# Patient Record
Sex: Female | Born: 1965
Health system: Southern US, Community
[De-identification: ages and names within clinical notes are randomized; demographics above are authoritative.]

## PROBLEM LIST (undated history)

## (undated) DIAGNOSIS — B9689 Other specified bacterial agents as the cause of diseases classified elsewhere: Secondary | ICD-10-CM

## (undated) DIAGNOSIS — N760 Acute vaginitis: Secondary | ICD-10-CM

## (undated) HISTORY — PX: BACK SURGERY: SHX140

---

## 1997-07-30 ENCOUNTER — Other Ambulatory Visit: Admission: RE | Admit: 1997-07-30 | Discharge: 1997-07-30 | Payer: Self-pay | Admitting: Gynecology

## 1997-08-06 ENCOUNTER — Other Ambulatory Visit: Admission: RE | Admit: 1997-08-06 | Discharge: 1997-08-06 | Payer: Self-pay | Admitting: Obstetrics and Gynecology

## 1997-09-15 ENCOUNTER — Inpatient Hospital Stay (HOSPITAL_COMMUNITY): Admission: AD | Admit: 1997-09-15 | Discharge: 1997-09-15 | Payer: Self-pay | Admitting: Gynecology

## 1998-03-06 ENCOUNTER — Inpatient Hospital Stay (HOSPITAL_COMMUNITY): Admission: AD | Admit: 1998-03-06 | Discharge: 1998-03-06 | Payer: Self-pay | Admitting: Gynecology

## 1998-03-21 ENCOUNTER — Inpatient Hospital Stay (HOSPITAL_COMMUNITY): Admission: AD | Admit: 1998-03-21 | Discharge: 1998-03-23 | Payer: Self-pay | Admitting: Obstetrics and Gynecology

## 1998-05-16 ENCOUNTER — Other Ambulatory Visit: Admission: RE | Admit: 1998-05-16 | Discharge: 1998-05-16 | Payer: Self-pay | Admitting: Gynecology

## 1999-01-27 ENCOUNTER — Emergency Department (HOSPITAL_COMMUNITY): Admission: EM | Admit: 1999-01-27 | Discharge: 1999-01-28 | Payer: Self-pay | Admitting: Emergency Medicine

## 2001-08-19 ENCOUNTER — Inpatient Hospital Stay (HOSPITAL_COMMUNITY): Admission: AD | Admit: 2001-08-19 | Discharge: 2001-08-19 | Payer: Self-pay | Admitting: Obstetrics and Gynecology

## 2001-12-10 ENCOUNTER — Inpatient Hospital Stay (HOSPITAL_COMMUNITY): Admission: AD | Admit: 2001-12-10 | Discharge: 2001-12-10 | Payer: Self-pay | Admitting: Obstetrics

## 2002-02-05 ENCOUNTER — Inpatient Hospital Stay (HOSPITAL_COMMUNITY): Admission: AD | Admit: 2002-02-05 | Discharge: 2002-02-05 | Payer: Self-pay | Admitting: Obstetrics

## 2002-02-18 ENCOUNTER — Inpatient Hospital Stay (HOSPITAL_COMMUNITY): Admission: AD | Admit: 2002-02-18 | Discharge: 2002-02-18 | Payer: Self-pay | Admitting: Obstetrics

## 2002-02-26 ENCOUNTER — Encounter: Payer: Self-pay | Admitting: Obstetrics

## 2002-02-26 ENCOUNTER — Inpatient Hospital Stay (HOSPITAL_COMMUNITY): Admission: AD | Admit: 2002-02-26 | Discharge: 2002-02-27 | Payer: Self-pay | Admitting: Obstetrics

## 2003-03-10 ENCOUNTER — Emergency Department (HOSPITAL_COMMUNITY): Admission: EM | Admit: 2003-03-10 | Discharge: 2003-03-10 | Payer: Self-pay | Admitting: Emergency Medicine

## 2004-06-07 ENCOUNTER — Emergency Department (HOSPITAL_COMMUNITY): Admission: EM | Admit: 2004-06-07 | Discharge: 2004-06-07 | Payer: Self-pay | Admitting: Emergency Medicine

## 2004-06-15 ENCOUNTER — Emergency Department (HOSPITAL_COMMUNITY): Admission: EM | Admit: 2004-06-15 | Discharge: 2004-06-15 | Payer: Self-pay | Admitting: Emergency Medicine

## 2004-06-19 ENCOUNTER — Emergency Department (HOSPITAL_COMMUNITY): Admission: EM | Admit: 2004-06-19 | Discharge: 2004-06-19 | Payer: Self-pay | Admitting: Family Medicine

## 2004-06-21 ENCOUNTER — Ambulatory Visit (HOSPITAL_COMMUNITY): Admission: RE | Admit: 2004-06-21 | Discharge: 2004-06-21 | Payer: Self-pay | Admitting: Family Medicine

## 2004-07-05 ENCOUNTER — Emergency Department (HOSPITAL_COMMUNITY): Admission: EM | Admit: 2004-07-05 | Discharge: 2004-07-05 | Payer: Self-pay | Admitting: Emergency Medicine

## 2004-07-14 ENCOUNTER — Ambulatory Visit (HOSPITAL_COMMUNITY): Admission: RE | Admit: 2004-07-14 | Discharge: 2004-07-15 | Payer: Self-pay | Admitting: Neurosurgery

## 2004-09-08 ENCOUNTER — Emergency Department (HOSPITAL_COMMUNITY): Admission: EM | Admit: 2004-09-08 | Discharge: 2004-09-08 | Payer: Self-pay | Admitting: Family Medicine

## 2005-01-19 ENCOUNTER — Emergency Department (HOSPITAL_COMMUNITY): Admission: EM | Admit: 2005-01-19 | Discharge: 2005-01-19 | Payer: Self-pay | Admitting: Family Medicine

## 2008-08-17 ENCOUNTER — Emergency Department (HOSPITAL_COMMUNITY): Admission: EM | Admit: 2008-08-17 | Discharge: 2008-08-17 | Payer: Self-pay | Admitting: Emergency Medicine

## 2008-10-26 ENCOUNTER — Emergency Department (HOSPITAL_COMMUNITY): Admission: EM | Admit: 2008-10-26 | Discharge: 2008-10-26 | Payer: Self-pay | Admitting: Family Medicine

## 2009-03-06 ENCOUNTER — Emergency Department (HOSPITAL_BASED_OUTPATIENT_CLINIC_OR_DEPARTMENT_OTHER): Admission: EM | Admit: 2009-03-06 | Discharge: 2009-03-06 | Payer: Self-pay | Admitting: Emergency Medicine

## 2009-07-30 ENCOUNTER — Emergency Department (HOSPITAL_BASED_OUTPATIENT_CLINIC_OR_DEPARTMENT_OTHER): Admission: EM | Admit: 2009-07-30 | Discharge: 2009-07-30 | Payer: Self-pay | Admitting: Emergency Medicine

## 2010-01-31 ENCOUNTER — Emergency Department (HOSPITAL_BASED_OUTPATIENT_CLINIC_OR_DEPARTMENT_OTHER)
Admission: EM | Admit: 2010-01-31 | Discharge: 2010-01-31 | Payer: Self-pay | Source: Home / Self Care | Admitting: Emergency Medicine

## 2010-04-13 LAB — URINALYSIS, ROUTINE W REFLEX MICROSCOPIC
Bilirubin Urine: NEGATIVE
Glucose, UA: NEGATIVE mg/dL
Hgb urine dipstick: NEGATIVE
Ketones, ur: NEGATIVE mg/dL
Nitrite: NEGATIVE
Protein, ur: NEGATIVE mg/dL
Specific Gravity, Urine: 1.015 (ref 1.005–1.030)
Urobilinogen, UA: 1 mg/dL (ref 0.0–1.0)
pH: 7.5 (ref 5.0–8.0)

## 2010-04-13 LAB — WET PREP, GENITAL
Trich, Wet Prep: NONE SEEN
Yeast Wet Prep HPF POC: NONE SEEN

## 2010-04-13 LAB — GC/CHLAMYDIA PROBE AMP, GENITAL
Chlamydia, DNA Probe: NEGATIVE
GC Probe Amp, Genital: NEGATIVE

## 2010-04-13 LAB — PREGNANCY, URINE: Preg Test, Ur: NEGATIVE

## 2010-04-19 LAB — URINALYSIS, ROUTINE W REFLEX MICROSCOPIC
Bilirubin Urine: NEGATIVE
Glucose, UA: NEGATIVE mg/dL
Ketones, ur: NEGATIVE mg/dL
Nitrite: NEGATIVE
Protein, ur: NEGATIVE mg/dL
Specific Gravity, Urine: 1.026 (ref 1.005–1.030)
Urobilinogen, UA: 0.2 mg/dL (ref 0.0–1.0)
pH: 5.5 (ref 5.0–8.0)

## 2010-04-19 LAB — URINE MICROSCOPIC-ADD ON

## 2010-04-19 LAB — GC/CHLAMYDIA PROBE AMP, GENITAL
Chlamydia, DNA Probe: NEGATIVE
GC Probe Amp, Genital: NEGATIVE

## 2010-04-19 LAB — WET PREP, GENITAL
Trich, Wet Prep: NONE SEEN
Yeast Wet Prep HPF POC: NONE SEEN

## 2010-04-19 LAB — PREGNANCY, URINE: Preg Test, Ur: NEGATIVE

## 2010-04-23 LAB — URINALYSIS, ROUTINE W REFLEX MICROSCOPIC
Bilirubin Urine: NEGATIVE
Glucose, UA: NEGATIVE mg/dL
Ketones, ur: NEGATIVE mg/dL
Nitrite: NEGATIVE
Protein, ur: NEGATIVE mg/dL
Specific Gravity, Urine: 1.028 (ref 1.005–1.030)
Urobilinogen, UA: 1 mg/dL (ref 0.0–1.0)
pH: 6 (ref 5.0–8.0)

## 2010-04-23 LAB — URINE MICROSCOPIC-ADD ON

## 2010-04-23 LAB — PREGNANCY, URINE: Preg Test, Ur: NEGATIVE

## 2010-05-10 LAB — WET PREP, GENITAL
Clue Cells Wet Prep HPF POC: NONE SEEN
Trich, Wet Prep: NONE SEEN
Yeast Wet Prep HPF POC: NONE SEEN

## 2010-05-10 LAB — POCT PREGNANCY, URINE: Preg Test, Ur: NEGATIVE

## 2010-05-10 LAB — POCT URINALYSIS DIP (DEVICE)
Bilirubin Urine: NEGATIVE
Glucose, UA: NEGATIVE mg/dL
Ketones, ur: NEGATIVE mg/dL
Nitrite: NEGATIVE
Protein, ur: NEGATIVE mg/dL
Specific Gravity, Urine: 1.025 (ref 1.005–1.030)
Urobilinogen, UA: 0.2 mg/dL (ref 0.0–1.0)
pH: 5 (ref 5.0–8.0)

## 2010-05-10 LAB — GC/CHLAMYDIA PROBE AMP, GENITAL
Chlamydia, DNA Probe: NEGATIVE
GC Probe Amp, Genital: NEGATIVE

## 2010-06-19 NOTE — Op Note (Signed)
NAMEKIRSTA, PROBERT            ACCOUNT NO.:  [id number]   MEDICAL RECORD NO.:  [id number]          PATIENT TYPE:  OIB   LOCATION:  2899                         FACILITY:  [facility]   PHYSICIAN:  Hilda Lias, M.D.   DATE OF BIRTH:  [birth year]   DATE OF PROCEDURE:  07/14/2004  DATE OF DISCHARGE:                                 OPERATIVE REPORT   PREOPERATIVE DIAGNOSIS:  Left L5-S1 herniated disk.   POSTOPERATIVE DIAGNOSIS:  Left L5-S1 herniated disk.   PROCEDURE:  L5-S1 diskectomy, removal of large fragment compromising the S1  nerve root. Microscope.   SURGEON:  Hilda Lias, M.D.   ASSISTANT:  Danae Orleans. Venetia Maxon, M.D.   CLINICAL HISTORY:  This is a patient who came to my office complaining of  back and left leg pain. The patient has a large herniated disk. She had been  getting worse, and the patient developed quite a bit of weakness. X-rays  showed large herniated disk at L5-S1. Surgery was advised _________ history  and physical.   PROCEDURE:  The patient was taken to the OR. She was placed in the prone  manner.  The back was prepped with Betadine. Midline incision from L5 to S1  was made and the muscles were retracted on the left side. With the  microscope because this lady was quite obese, we went through the lower  lamina of L5 and the upper of S1, we went through the medial facet. The  yellow ligament was also excised. We had quite a difficult time retracting  the S1 nerve root because there was complete adhesion between the disk and  the nerve. Finally, we were able to make a small incision, and three large  pieces of fragment were removed. We entered the disk space, and total  diskectomy medial and lateral was achieved. At the end, we had a good  depression of the L5-S1 nerve root. The S1 nerve root was swollen and  reddish secondary to the compression. Valsalva maneuver was negative.  __________ the wound was irrigated.  Fentanyl and Depo-Medrol were left in  the  _________ space, and the wound was closed with Vicryl and Steri-Strips.       EB/MEDQ  D:  07/14/2004  T:  07/14/2004  Job:  [id number]

## 2010-08-03 ENCOUNTER — Emergency Department (HOSPITAL_BASED_OUTPATIENT_CLINIC_OR_DEPARTMENT_OTHER)
Admission: EM | Admit: 2010-08-03 | Discharge: 2010-08-03 | Disposition: A | Discharge status: Home / Self Care | Payer: No Typology Code available for payment source | Attending: Emergency Medicine | Admitting: Emergency Medicine

## 2010-08-03 DIAGNOSIS — Z043 Encounter for examination and observation following other accident: Secondary | ICD-10-CM | POA: Insufficient documentation

## 2010-08-21 ENCOUNTER — Encounter: Payer: Self-pay | Admitting: *Deleted

## 2010-08-21 ENCOUNTER — Emergency Department (HOSPITAL_BASED_OUTPATIENT_CLINIC_OR_DEPARTMENT_OTHER)
Admission: EM | Admit: 2010-08-21 | Discharge: 2010-08-21 | Discharge status: Against Medical Advice | Payer: No Typology Code available for payment source | Attending: Emergency Medicine | Admitting: Emergency Medicine

## 2010-08-21 DIAGNOSIS — M795 Residual foreign body in soft tissue: Secondary | ICD-10-CM | POA: Insufficient documentation

## 2010-08-21 DIAGNOSIS — R51 Headache: Secondary | ICD-10-CM

## 2010-08-21 DIAGNOSIS — Z1881 Retained glass fragments: Secondary | ICD-10-CM | POA: Insufficient documentation

## 2010-08-21 DIAGNOSIS — W458XXA Other foreign body or object entering through skin, initial encounter: Secondary | ICD-10-CM

## 2010-08-21 MED ORDER — HYDROCODONE-ACETAMINOPHEN 5-325 MG PO TABS: 2.0000 | ORAL_TABLET | ORAL | Status: AC | PRN

## 2010-08-21 NOTE — ED Provider Notes (Signed)
Medical screening examination/treatment/procedure(s) were performed by non-physician practitioner and as supervising physician I was immediately available for consultation/collaboration.  Ethelda Chick, MD 08/21/10 [phone]
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
## 2010-11-10 ENCOUNTER — Emergency Department (INDEPENDENT_AMBULATORY_CARE_PROVIDER_SITE_OTHER): Payer: No Typology Code available for payment source

## 2010-11-10 ENCOUNTER — Encounter (HOSPITAL_BASED_OUTPATIENT_CLINIC_OR_DEPARTMENT_OTHER): Payer: Self-pay | Admitting: Emergency Medicine

## 2010-11-10 ENCOUNTER — Emergency Department (HOSPITAL_BASED_OUTPATIENT_CLINIC_OR_DEPARTMENT_OTHER)
Admission: EM | Admit: 2010-11-10 | Discharge: 2010-11-10 | Disposition: A | Discharge status: Home / Self Care | Payer: No Typology Code available for payment source | Attending: Emergency Medicine | Admitting: Emergency Medicine

## 2010-11-10 DIAGNOSIS — S139XXA Sprain of joints and ligaments of unspecified parts of neck, initial encounter: Secondary | ICD-10-CM | POA: Insufficient documentation

## 2010-11-10 DIAGNOSIS — Y92410 Unspecified street and highway as the place of occurrence of the external cause: Secondary | ICD-10-CM | POA: Insufficient documentation

## 2010-11-10 DIAGNOSIS — S161XXA Strain of muscle, fascia and tendon at neck level, initial encounter: Secondary | ICD-10-CM

## 2010-11-10 DIAGNOSIS — M479 Spondylosis, unspecified: Secondary | ICD-10-CM

## 2010-11-10 MED ORDER — HYDROCODONE-ACETAMINOPHEN 5-500 MG PO TABS: 1.0000 | ORAL_TABLET | Freq: Four times a day (QID) | ORAL | Status: AC | PRN

## 2010-11-10 MED ORDER — CYCLOBENZAPRINE HCL 10 MG PO TABS: 10.0000 mg | ORAL_TABLET | Freq: Two times a day (BID) | ORAL | Status: AC | PRN
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
## 2010-11-17 NOTE — ED Provider Notes (Signed)
Medical screening examination/treatment/procedure(s) were performed by non-physician practitioner and as supervising physician I was immediately available for consultation/collaboration.  Hurman Horn, MD 11/17/10 2132

## 2010-11-26 ENCOUNTER — Ambulatory Visit: Payer: No Typology Code available for payment source | Admitting: Physical Therapy

## 2012-01-30 ENCOUNTER — Emergency Department (HOSPITAL_BASED_OUTPATIENT_CLINIC_OR_DEPARTMENT_OTHER)
Admission: EM | Admit: 2012-01-30 | Discharge: 2012-01-30 | Disposition: A | Discharge status: Home / Self Care | Payer: Self-pay | Attending: Emergency Medicine | Admitting: Emergency Medicine

## 2012-01-30 ENCOUNTER — Encounter (HOSPITAL_BASED_OUTPATIENT_CLINIC_OR_DEPARTMENT_OTHER): Payer: Self-pay | Admitting: *Deleted

## 2012-01-30 DIAGNOSIS — N760 Acute vaginitis: Secondary | ICD-10-CM | POA: Insufficient documentation

## 2012-01-30 DIAGNOSIS — N730 Acute parametritis and pelvic cellulitis: Secondary | ICD-10-CM | POA: Insufficient documentation

## 2012-01-30 HISTORY — DX: Other specified bacterial agents as the cause of diseases classified elsewhere: N76.0

## 2012-01-30 HISTORY — DX: Other specified bacterial agents as the cause of diseases classified elsewhere: B96.89

## 2012-01-30 LAB — PREGNANCY, URINE: Preg Test, Ur: NEGATIVE

## 2012-01-30 LAB — URINALYSIS, ROUTINE W REFLEX MICROSCOPIC
Glucose, UA: NEGATIVE mg/dL
Ketones, ur: NEGATIVE mg/dL
Nitrite: NEGATIVE
Specific Gravity, Urine: 1.02 (ref 1.005–1.030)
pH: 6 (ref 5.0–8.0)

## 2012-01-30 LAB — WET PREP, GENITAL: Yeast Wet Prep HPF POC: NONE SEEN

## 2012-01-30 LAB — URINE MICROSCOPIC-ADD ON

## 2012-01-30 MED ORDER — CEFTRIAXONE SODIUM 250 MG IJ SOLR
250.0000 mg | INTRAMUSCULAR | Status: DC
  Administered 2012-01-30: 250 mg via INTRAMUSCULAR
  Filled 2012-01-30: qty 250

## 2012-01-30 MED ORDER — DOXYCYCLINE HYCLATE 100 MG PO CAPS: 100.0000 mg | ORAL_CAPSULE | Freq: Two times a day (BID) | ORAL | Status: AC

## 2012-01-30 MED ORDER — METRONIDAZOLE 500 MG PO TABS: 500.0000 mg | ORAL_TABLET | Freq: Two times a day (BID) | ORAL | Status: DC

## 2012-01-30 MED ORDER — AZITHROMYCIN 250 MG PO TABS
1000.0000 mg | ORAL_TABLET | Freq: Once | ORAL | Status: AC
  Administered 2012-01-30: 1000 mg via ORAL
  Filled 2012-01-30: qty 4
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
## 2012-01-30 NOTE — ED Provider Notes (Signed)
History     CSN: [id number]  Arrival date & time 01/30/12  1252   First MD Initiated Contact with Patient 01/30/12 1328      Chief Complaint  Patient presents with  . Vaginal Discharge    (Consider location/radiation/quality/duration/timing/severity/associated sxs/prior treatment) Patient is a 46 y.o. female presenting with vaginal discharge. The history is provided by the patient. No language interpreter was used.  Vaginal Discharge This is a recurrent problem. The current episode started yesterday. The problem has been unchanged. Pertinent negatives include no abdominal pain, fever, nausea or vomiting. She has tried nothing for the symptoms.   46 year old female coming in with a vaginal discharge since her periods stopped 5-7 days again it. Patient is sexually active with one partner and uses a condom. She also has an IUD. Denies fever nausea vomiting.  Past Medical History  Diagnosis Date  . BV (bacterial vaginosis)     Past Surgical History  Procedure Date  . Back surgery   . Back surgery     History reviewed. No pertinent family history.  History  Substance Use Topics  . Smoking status: Never Smoker   . Smokeless tobacco: Not on file  . Alcohol Use: No    OB History    Grav Para Term Preterm Abortions TAB SAB Ect Mult Living                  Review of Systems  Constitutional: Negative.  Negative for fever.  HENT: Negative.   Eyes: Negative.   Respiratory: Negative.   Cardiovascular: Negative.   Gastrointestinal: Negative.  Negative for nausea, vomiting and abdominal pain.  Genitourinary: Positive for vaginal discharge.  Neurological: Negative.   Psychiatric/Behavioral: Negative.   All other systems reviewed and are negative.    Allergies  Review of patient's allergies indicates no known allergies.  Home Medications  No current outpatient prescriptions on file.  BP 134/71  Pulse 93  Temp 98.5 F (36.9 C) (Oral)  Resp 18  Ht 5\' 5"  (1.651 m)   Wt 182 lb (82.555 kg)  BMI 30.29 kg/m2  SpO2 100%  LMP 01/29/2012  Physical Exam  Nursing note and vitals reviewed. Constitutional: She is oriented to person, place, and time. She appears well-developed and well-nourished.  HENT:  Head: Normocephalic and atraumatic.  Eyes: Conjunctivae normal and EOM are normal. Pupils are equal, round, and reactive to light.  Neck: Normal range of motion. Neck supple.  Cardiovascular: Normal rate.   Pulmonary/Chest: Effort normal.  Abdominal: Soft.  Musculoskeletal: Normal range of motion. She exhibits no edema and no tenderness.  Neurological: She is alert and oriented to person, place, and time. She has normal reflexes.  Skin: Skin is warm and dry.  Psychiatric: She has a normal mood and affect.    ED Course  Pelvic exam Date/Time: 01/30/2012 3:37 PM Performed by: Remi Haggard Authorized by: Remi Haggard Consent: Verbal consent obtained. Written consent not obtained. Risks and benefits: risks, benefits and alternatives were discussed Consent given by: patient Patient understanding: patient states understanding of the procedure being performed Patient identity confirmed: verbally with patient, arm band, provided demographic data and hospital-assigned identification number Time out: Immediately prior to procedure a "time out" was called to verify the correct patient, procedure, equipment, support staff and site/side marked as required. Patient tolerance: Patient tolerated the procedure well with no immediate complications. Comments: . On vaginal discharge. Cervix friable. IUD in place. CMT. We'll treat for pelvic inflammatory disease.   (including critical care time)  Labs Reviewed  URINALYSIS, ROUTINE W REFLEX MICROSCOPIC - Abnormal; Notable for the following:    Hgb urine dipstick SMALL (*)     Leukocytes, UA SMALL (*)     All other components within normal limits  WET PREP, GENITAL - Abnormal; Notable for the following:    Clue  Cells Wet Prep HPF POC FEW (*)     WBC, Wet Prep HPF POC MANY (*)     All other components within normal limits  URINE MICROSCOPIC-ADD ON - Abnormal; Notable for the following:    Squamous Epithelial / LPF FEW (*)     Bacteria, UA MANY (*)     All other components within normal limits  PREGNANCY, URINE  GC/CHLAMYDIA PROBE AMP   No results found.   No diagnosis found.    MDM  PID treated with Rocephin and azithromycin in the ER. Rx for Flagyl and doxycycline. Patient instructed to followup at the health department in 7-10 days no intercourse until recheck. Partner will be checked as well and treated. She understands to return to the ER for uncontrolled nausea vomiting or high fever. Labs Reviewed  URINALYSIS, ROUTINE W REFLEX MICROSCOPIC - Abnormal; Notable for the following:    Hgb urine dipstick SMALL (*)     Leukocytes, UA SMALL (*)     All other components within normal limits  WET PREP, GENITAL - Abnormal; Notable for the following:    Clue Cells Wet Prep HPF POC FEW (*)     WBC, Wet Prep HPF POC MANY (*)     All other components within normal limits  URINE MICROSCOPIC-ADD ON - Abnormal; Notable for the following:    Squamous Epithelial / LPF FEW (*)     Bacteria, UA MANY (*)     All other components within normal limits  PREGNANCY, URINE  GC/CHLAMYDIA PROBE AMP          Remi Haggard, NP 01/30/12 1606

## 2012-02-01 LAB — GC/CHLAMYDIA PROBE AMP: GC Probe RNA: NEGATIVE

## 2012-02-01 NOTE — ED Provider Notes (Signed)
Medical screening examination/treatment/procedure(s) were performed by non-physician practitioner and as supervising physician I was immediately available for consultation/collaboration.  Ariadne Rissmiller, MD 02/01/12 0944 

## 2012-06-22 ENCOUNTER — Encounter (HOSPITAL_BASED_OUTPATIENT_CLINIC_OR_DEPARTMENT_OTHER): Payer: Self-pay | Admitting: *Deleted

## 2012-06-22 ENCOUNTER — Emergency Department (HOSPITAL_BASED_OUTPATIENT_CLINIC_OR_DEPARTMENT_OTHER)
Admission: EM | Admit: 2012-06-22 | Discharge: 2012-06-22 | Disposition: A | Discharge status: Home / Self Care | Payer: No Typology Code available for payment source | Attending: Emergency Medicine | Admitting: Emergency Medicine

## 2012-06-22 DIAGNOSIS — M545 Low back pain, unspecified: Secondary | ICD-10-CM | POA: Insufficient documentation

## 2012-06-22 DIAGNOSIS — M5430 Sciatica, unspecified side: Secondary | ICD-10-CM | POA: Insufficient documentation

## 2012-06-22 DIAGNOSIS — G8911 Acute pain due to trauma: Secondary | ICD-10-CM | POA: Insufficient documentation

## 2012-06-22 DIAGNOSIS — Z8619 Personal history of other infectious and parasitic diseases: Secondary | ICD-10-CM | POA: Insufficient documentation

## 2012-06-22 DIAGNOSIS — Z87828 Personal history of other (healed) physical injury and trauma: Secondary | ICD-10-CM | POA: Insufficient documentation

## 2012-06-22 DIAGNOSIS — Z9889 Other specified postprocedural states: Secondary | ICD-10-CM | POA: Insufficient documentation

## 2012-06-22 DIAGNOSIS — M5431 Sciatica, right side: Secondary | ICD-10-CM

## 2012-06-22 MED ORDER — CYCLOBENZAPRINE HCL 10 MG PO TABS: 10.0000 mg | ORAL_TABLET | Freq: Two times a day (BID) | ORAL | Status: AC | PRN

## 2012-06-22 MED ORDER — MELOXICAM 7.5 MG PO TABS: 15.0000 mg | ORAL_TABLET | Freq: Every day | ORAL | Status: AC
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
## 2012-06-22 NOTE — ED Notes (Signed)
Pt came to desk stating "I am tired of waiting 1hr 30 mins."  RN Misty made aware.

## 2012-06-22 NOTE — ED Notes (Signed)
Pt. Wanting to leave and pick her son up from school.  Pt. Encouraged to stay.
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
## 2012-06-27 NOTE — ED Provider Notes (Signed)
Medical screening examination/treatment/procedure(s) were performed by non-physician practitioner and as supervising physician I was immediately available for consultation/collaboration.   Congetta Odriscoll W. Jozsef Wescoat, MD 06/27/12 0842 
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
## 2013-09-23 ENCOUNTER — Encounter (HOSPITAL_BASED_OUTPATIENT_CLINIC_OR_DEPARTMENT_OTHER): Payer: Self-pay | Admitting: Emergency Medicine

## 2013-09-23 ENCOUNTER — Emergency Department (HOSPITAL_BASED_OUTPATIENT_CLINIC_OR_DEPARTMENT_OTHER)
Admission: EM | Admit: 2013-09-23 | Discharge: 2013-09-23 | Disposition: A | Discharge status: Home / Self Care | Payer: No Typology Code available for payment source | Attending: Emergency Medicine | Admitting: Emergency Medicine

## 2013-09-23 DIAGNOSIS — S99929A Unspecified injury of unspecified foot, initial encounter: Secondary | ICD-10-CM

## 2013-09-23 DIAGNOSIS — Z8742 Personal history of other diseases of the female genital tract: Secondary | ICD-10-CM | POA: Insufficient documentation

## 2013-09-23 DIAGNOSIS — W268XXA Contact with other sharp object(s), not elsewhere classified, initial encounter: Secondary | ICD-10-CM | POA: Insufficient documentation

## 2013-09-23 DIAGNOSIS — Z792 Long term (current) use of antibiotics: Secondary | ICD-10-CM | POA: Insufficient documentation

## 2013-09-23 DIAGNOSIS — Y9229 Other specified public building as the place of occurrence of the external cause: Secondary | ICD-10-CM | POA: Insufficient documentation

## 2013-09-23 DIAGNOSIS — T148XXA Other injury of unspecified body region, initial encounter: Secondary | ICD-10-CM

## 2013-09-23 DIAGNOSIS — S8990XA Unspecified injury of unspecified lower leg, initial encounter: Secondary | ICD-10-CM | POA: Insufficient documentation

## 2013-09-23 DIAGNOSIS — Z791 Long term (current) use of non-steroidal anti-inflammatories (NSAID): Secondary | ICD-10-CM | POA: Insufficient documentation

## 2013-09-23 DIAGNOSIS — S99919A Unspecified injury of unspecified ankle, initial encounter: Secondary | ICD-10-CM

## 2013-09-23 DIAGNOSIS — Z8619 Personal history of other infectious and parasitic diseases: Secondary | ICD-10-CM | POA: Insufficient documentation

## 2013-09-23 DIAGNOSIS — Y9389 Activity, other specified: Secondary | ICD-10-CM | POA: Insufficient documentation

## 2013-09-23 DIAGNOSIS — Z23 Encounter for immunization: Secondary | ICD-10-CM | POA: Insufficient documentation

## 2013-09-23 DIAGNOSIS — S91309A Unspecified open wound, unspecified foot, initial encounter: Secondary | ICD-10-CM | POA: Insufficient documentation

## 2013-09-23 MED ORDER — CIPROFLOXACIN HCL 500 MG PO TABS: 500.0000 mg | ORAL_TABLET | Freq: Two times a day (BID) | ORAL | Status: AC

## 2013-09-23 MED ORDER — HYDROCODONE-ACETAMINOPHEN 5-325 MG PO TABS
1.0000 | ORAL_TABLET | ORAL | Status: AC | PRN
Start: 2013-09-23 — End: ?

## 2013-09-23 MED ORDER — CIPROFLOXACIN HCL 500 MG PO TABS
500.0000 mg | ORAL_TABLET | Freq: Once | ORAL | Status: AC
  Administered 2013-09-23: 500 mg via ORAL
  Filled 2013-09-23: qty 1

## 2013-09-23 NOTE — ED Provider Notes (Signed)
CSN: [id number]     Arrival date & time 09/23/13  1942 History  This chart was scribed for Rolland Porter, MD by Evon Slack, ED Scribe. This patient was seen in room MH09/MH09 and the patient's care was started at 8:39 PM.    Chief Complaint  Patient presents with  . Foot Pain   Patient is a 48 y.o. female presenting with lower extremity pain. The history is provided by the patient. No language interpreter was used.  Foot Pain Pertinent negatives include no chest pain, no abdominal pain, no headaches and no shortness of breath.   HPI Comments: Janice Green is a 48 y.o. female who presents to the Emergency Department complaining of right foot pain onset 1 day prior. She states she was shopping yesterday and stepped on a upside down sensor nail. She states that the foot is very painful. She states she is having associated gait problem.    Past Medical History  Diagnosis Date  . BV (bacterial vaginosis)    Past Surgical History  Procedure Laterality Date  . Back surgery    . Back surgery     History reviewed. No pertinent family history. History  Substance Use Topics  . Smoking status: Never Smoker   . Smokeless tobacco: Not on file  . Alcohol Use: No   OB History   Grav Para Term Preterm Abortions TAB SAB Ect Mult Living                 Review of Systems  Constitutional: Negative for fever, chills, diaphoresis, appetite change and fatigue.  HENT: Negative for mouth sores, sore throat and trouble swallowing.   Eyes: Negative for visual disturbance.  Respiratory: Negative for cough, chest tightness, shortness of breath and wheezing.   Cardiovascular: Negative for chest pain.  Gastrointestinal: Negative for nausea, vomiting, abdominal pain, diarrhea and abdominal distention.  Endocrine: Negative for polydipsia, polyphagia and polyuria.  Genitourinary: Negative for dysuria, frequency and hematuria.  Musculoskeletal: Positive for gait problem.  Skin: Positive for wound.  Negative for color change, pallor and rash.  Neurological: Negative for dizziness, syncope, light-headedness and headaches.  Hematological: Does not bruise/bleed easily.  Psychiatric/Behavioral: Negative for behavioral problems and confusion.     Allergies  Review of patient's allergies indicates no known allergies.  Home Medications   Prior to Admission medications   Medication Sig Start Date End Date Taking? Authorizing Provider  ciprofloxacin (CIPRO) 500 MG tablet Take 1 tablet (500 mg total) by mouth every 12 (twelve) hours. 09/23/13   Rolland Porter, MD  cyclobenzaprine (FLEXERIL) 10 MG tablet Take 1 tablet (10 mg total) by mouth 2 (two) times daily as needed for muscle spasms. 06/22/12   Junius Finner, PA-C  doxycycline (VIBRAMYCIN) 100 MG capsule Take 1 capsule (100 mg total) by mouth 2 (two) times daily. 01/30/12   Remi Haggard, NP  HYDROcodone-acetaminophen (NORCO/VICODIN) 5-325 MG per tablet Take 1 tablet by mouth every 4 (four) hours as needed. 09/23/13   Rolland Porter, MD  meloxicam (MOBIC) 7.5 MG tablet Take 2 tablets (15 mg total) by mouth daily. 06/22/12   Junius Finner, PA-C  metroNIDAZOLE (FLAGYL) 500 MG tablet Take 1 tablet (500 mg total) by mouth 2 (two) times daily. 01/30/12   Remi Haggard, NP   Triage vitals: BP 130/78  Pulse 92  Temp(Src) 98.4 F (36.9 C) (Oral)  Resp 16  Ht  (1.651 m)  Wt 190 lb (86.183 kg)  BMI 31.62 kg/m2  SpO2 100%  LMP 09/22/2013  Physical Exam  Nursing note and vitals reviewed. Musculoskeletal:  Right Foot: Puncture wound adjacent to distal 5th metatarsal no soft tissue swelling, tender to musculature of right lower leg.     ED Course  Procedures (including critical care time) DIAGNOSTIC STUDIES: Oxygen Saturation is 100% on RA, normal by my interpretation.    COORDINATION OF CARE: 8:47 PM-Discussed treatment plan which includes antibiotics, pain medication and cruthces with pt at bedside and pt agreed to plan.     Labs  Review Labs Reviewed - No data to display  Imaging Review No results found.   EKG Interpretation None      MDM   Final diagnoses:  Puncture wound     No signs of acute infection   Plan: avoid ambulation, cipro post-exposure prophylaxis, recheck with any additional symptoms, signs of infection.    I personally performed the services described in this documentation, which was scribed in my presence. The recorded information has been reviewed and is accurate.      Rolland Porter, MD 09/25/13 [phone]

## 2013-09-23 NOTE — ED Notes (Signed)
Patient was out shopping and stepped on a sharp object yesterday

## 2013-09-23 NOTE — Discharge Instructions (Signed)
Puncture Wound °A puncture wound is an injury that extends through all layers of the skin and into the tissue beneath the skin (subcutaneous tissue). Puncture wounds become infected easily because germs often enter the body and go beneath the skin during the injury. Having a deep wound with a small entrance point makes it difficult for your caregiver to adequately clean the wound. This is especially true if you have stepped on a nail and it has passed through a dirty shoe or other situations where the wound is obviously contaminated. °CAUSES  °Many puncture wounds involve glass, nails, splinters, fish hooks, or other objects that enter the skin (foreign bodies). A puncture wound may also be caused by a human bite or animal bite. °DIAGNOSIS  °A puncture wound is usually diagnosed by your history and a physical exam. You may need to have an X-ray or an ultrasound to check for any foreign bodies still in the wound. °TREATMENT  °· Your caregiver will clean the wound as thoroughly as possible. Depending on the location of the wound, a bandage (dressing) may be applied. °· Your caregiver might prescribe antibiotic medicines. °· You may need a follow-up visit to check on your wound. Follow all instructions as directed by your caregiver. °HOME CARE INSTRUCTIONS  °· Change your dressing once per day, or as directed by your caregiver. If the dressing sticks, it may be removed by soaking the area in water. °· If your caregiver has given you follow-up instructions, it is very important that you return for a follow-up appointment. Not following up as directed could result in a chronic or permanent injury, pain, and disability. °· Only take over-the-counter or prescription medicines for pain, discomfort, or fever as directed by your caregiver. °· If you are given antibiotics, take them as directed. Finish them even if you start to feel better. °You may need a tetanus shot if: °· You cannot remember when you had your last tetanus  shot. °· You have never had a tetanus shot. °If you got a tetanus shot, your arm may swell, get red, and feel warm to the touch. This is common and not a problem. If you need a tetanus shot and you choose not to have one, there is a rare chance of getting tetanus. Sickness from tetanus can be serious. °You may need a rabies shot if an animal bite caused your puncture wound. °SEEK MEDICAL CARE IF:  °· You have redness, swelling, or increasing pain in the wound. °· You have red streaks going away from the wound. °· You notice a bad smell coming from the wound or dressing. °· You have yellowish-white fluid (pus) coming from the wound. °· You are treated with an antibiotic for infection, but the infection is not getting better. °· You notice something in the wound, such as rubber from your shoe, cloth, or another object. °· You have a fever. °· You have severe pain. °· You have difficulty breathing. °· You feel dizzy or faint. °· You cannot stop vomiting. °· You lose feeling, develop numbness, or cannot move a limb below the wound. °· Your symptoms worsen. °MAKE SURE YOU: °· Understand these instructions. °· Will watch your condition. °· Will get help right away if you are not doing well or get worse. °Document Released: 10/28/2004 Document Revised: 04/12/2011 Document Reviewed: 07/07/2010 °[employer]® Patient Information ©2015 [employer]. This information is not intended to replace advice given to you by your health care provider. Make sure you discuss any questions you   have with your health care provider. ° °

## 2013-09-25 MED ORDER — TETANUS-DIPHTH-ACELL PERTUSSIS 5-2.5-18.5 LF-MCG/0.5 IM SUSP
0.5000 mL | Freq: Once | INTRAMUSCULAR | Status: AC
  Administered 2013-09-25: 0.5 mL via INTRAMUSCULAR

## 2013-09-25 NOTE — ED Notes (Signed)
Pt returned to ED for TDap.  Immunization administered.

## 2017-11-04 ENCOUNTER — Encounter (HOSPITAL_BASED_OUTPATIENT_CLINIC_OR_DEPARTMENT_OTHER): Payer: Self-pay | Admitting: Emergency Medicine

## 2017-11-04 ENCOUNTER — Other Ambulatory Visit: Payer: Self-pay

## 2017-11-04 ENCOUNTER — Emergency Department (HOSPITAL_BASED_OUTPATIENT_CLINIC_OR_DEPARTMENT_OTHER)
Admission: EM | Admit: 2017-11-04 | Discharge: 2017-11-04 | Disposition: A | Discharge status: Home / Self Care | Payer: Self-pay | Attending: Emergency Medicine | Admitting: Emergency Medicine

## 2017-11-04 DIAGNOSIS — N760 Acute vaginitis: Secondary | ICD-10-CM | POA: Insufficient documentation

## 2017-11-04 DIAGNOSIS — R350 Frequency of micturition: Secondary | ICD-10-CM | POA: Insufficient documentation

## 2017-11-04 DIAGNOSIS — R51 Headache: Secondary | ICD-10-CM | POA: Insufficient documentation

## 2017-11-04 DIAGNOSIS — B9689 Other specified bacterial agents as the cause of diseases classified elsewhere: Secondary | ICD-10-CM | POA: Insufficient documentation

## 2017-11-04 DIAGNOSIS — N3000 Acute cystitis without hematuria: Secondary | ICD-10-CM | POA: Insufficient documentation

## 2017-11-04 DIAGNOSIS — R6883 Chills (without fever): Secondary | ICD-10-CM | POA: Insufficient documentation

## 2017-11-04 DIAGNOSIS — N898 Other specified noninflammatory disorders of vagina: Secondary | ICD-10-CM | POA: Insufficient documentation

## 2017-11-04 DIAGNOSIS — R1110 Vomiting, unspecified: Secondary | ICD-10-CM | POA: Insufficient documentation

## 2017-11-04 LAB — COMPREHENSIVE METABOLIC PANEL
ALBUMIN: 3.7 g/dL (ref 3.5–5.0)
ALT: 18 U/L (ref 0–44)
AST: 27 U/L (ref 15–41)
Alkaline Phosphatase: 59 U/L (ref 38–126)
Anion gap: 9 (ref 5–15)
BUN: 13 mg/dL (ref 6–20)
CHLORIDE: 103 mmol/L (ref 98–111)
CO2: 25 mmol/L (ref 22–32)
CREATININE: 0.73 mg/dL (ref 0.44–1.00)
Calcium: 9.1 mg/dL (ref 8.9–10.3)
GFR calc Af Amer: >60 mL/min (ref >60)
GFR calc non Af Amer: >60 mL/min (ref >60)
GLUCOSE: 106 mg/dL — AB (ref 70–99)
Potassium: 4.1 mmol/L (ref 3.5–5.1)
SODIUM: 137 mmol/L (ref 135–145)
Total Bilirubin: 0.7 mg/dL (ref 0.3–1.2)
Total Protein: 7.4 g/dL (ref 6.5–8.1)

## 2017-11-04 LAB — URINALYSIS, ROUTINE W REFLEX MICROSCOPIC
BILIRUBIN URINE: NEGATIVE
GLUCOSE, UA: NEGATIVE mg/dL
KETONES UR: NEGATIVE mg/dL
Nitrite: NEGATIVE
PH: 6 (ref 5.0–8.0)
Protein, ur: NEGATIVE mg/dL

## 2017-11-04 LAB — WET PREP, GENITAL
SPERM: NONE SEEN
TRICH WET PREP: NONE SEEN
YEAST WET PREP: NONE SEEN

## 2017-11-04 LAB — URINALYSIS, MICROSCOPIC (REFLEX)

## 2017-11-04 LAB — CBC
HCT: 33.6 % — ABNORMAL LOW (ref 36.0–46.0)
Hemoglobin: 10.3 g/dL — ABNORMAL LOW (ref 12.0–15.0)
MCH: 23 pg — AB (ref 26.0–34.0)
MCHC: 30.7 g/dL (ref 30.0–36.0)
MCV: 75.2 fL — AB (ref 78.0–100.0)
PLATELETS: 176 10*3/uL (ref 150–400)
RBC: 4.47 MIL/uL (ref 3.87–5.11)
RDW: 17.3 % — AB (ref 11.5–15.5)
WBC: 5.2 10*3/uL (ref 4.0–10.5)

## 2017-11-04 LAB — LIPASE, BLOOD: LIPASE: 34 U/L (ref 11–51)

## 2017-11-04 LAB — TROPONIN I

## 2017-11-04 LAB — PREGNANCY, URINE: Preg Test, Ur: NEGATIVE

## 2017-11-04 MED ORDER — CEPHALEXIN 500 MG PO CAPS: 500.0000 mg | ORAL_CAPSULE | Freq: Two times a day (BID) | ORAL | 0 refills | Status: AC

## 2017-11-04 MED ORDER — METRONIDAZOLE 500 MG PO TABS: 500.0000 mg | ORAL_TABLET | Freq: Two times a day (BID) | ORAL | 0 refills | Status: AC

## 2017-11-04 MED FILL — CEPHALEXIN 500 MG CAPSULE: 500 | 7 days supply | Qty: 14 | Fill #0

## 2017-11-04 MED FILL — metroNIDAZOLE 500 MG TABS: 500 | 7 days supply | Qty: 14 | Fill #0

## 2017-11-04 NOTE — ED Provider Notes (Signed)
[facility] EMERGENCY DEPARTMENT Provider Note   CSN: [id number] Arrival date & time: 11/04/17  0911     History   Chief Complaint Chief Complaint  Patient presents with  . Emesis  . Urinary Tract Infection    HPI Janice Green is a 52 y.o. female presents for evaluation of an episode yesterday that involved diaphoresis, chills, vomiting.  Patient reports that she was laying down the couch when all of a sudden she became very diaphoretic.  She states that she also felt chills on her entire body.  She reports that she had 2 episodes of nonbloody, nonbilious vomiting.  Patient states that at that time, she had no abdominal pain, chest pain or difficulty breathing.  Patient states that she has not had a similar episode since then.  She does report that right before this happened, she had eaten a cake that was very rich which she states she normally does not eat.  She came in today to make sure there is nothing else going on.  Patient states that she is also concerned she might have either a urinary tract infection or BV infection.  She states she has noticed a fishy odor but has not noticed any vaginal discharge.  Patient reports she has had some increased urinary frequency but has not had any dysuria or hematuria.  Patient reports she has a mild headache which she states is typical after her menstrual cycle which she states ended about 2 days ago.  Patient denies any vision changes, trauma, fall, numbness/weakness of her arms or legs.  She reports that she was able to eat and drink today without any difficulty.  On ED arrival, she states that she is currently not having any symptoms.  Patient denies any fevers.   The history is provided by the patient.    Past Medical History:  Diagnosis Date  . BV (bacterial vaginosis)     There are no active problems to display for this patient.   Past Surgical History:  Procedure Laterality Date  . BACK SURGERY    . BACK SURGERY        OB History   None      Home Medications    Prior to Admission medications   Medication Sig Start Date End Date Taking? Authorizing Provider  cephALEXin (KEFLEX) 500 MG capsule Take 1 capsule (500 mg total) by mouth 2 (two) times daily for 7 days. 11/04/17 11/11/17  Maxwell Caul, PA-C  ciprofloxacin (CIPRO) 500 MG tablet Take 1 tablet (500 mg total) by mouth every 12 (twelve) hours. 09/23/13   Rolland Porter, MD  cyclobenzaprine (FLEXERIL) 10 MG tablet Take 1 tablet (10 mg total) by mouth 2 (two) times daily as needed for muscle spasms. 06/22/12   Lurene Shadow, PA-C  doxycycline (VIBRAMYCIN) 100 MG capsule Take 1 capsule (100 mg total) by mouth 2 (two) times daily. 01/30/12   Jethro Bastos, NP  HYDROcodone-acetaminophen (NORCO/VICODIN) 5-325 MG per tablet Take 1 tablet by mouth every 4 (four) hours as needed. 09/23/13   Rolland Porter, MD  meloxicam (MOBIC) 7.5 MG tablet Take 2 tablets (15 mg total) by mouth daily. 06/22/12   Lurene Shadow, PA-C  metroNIDAZOLE (FLAGYL) 500 MG tablet Take 1 tablet (500 mg total) by mouth 2 (two) times daily. 11/04/17   Maxwell Caul, PA-C    Family History History reviewed. No pertinent family history.  Social History Social History   Tobacco Use  . Smoking status: Never  Smoker  Substance Use Topics  . Alcohol use: No  . Drug use: No     Allergies   Patient has no known allergies.   Review of Systems Review of Systems  Constitutional: Positive for diaphoresis (resolved). Negative for fever.  Respiratory: Negative for cough and shortness of breath.   Cardiovascular: Negative for chest pain.  Gastrointestinal: Positive for vomiting (Resolved). Negative for abdominal pain and nausea.  Genitourinary: Positive for frequency. Negative for dysuria and hematuria.  Neurological: Positive for headaches. Negative for weakness and numbness.  All other systems reviewed and are negative.    Physical Exam Updated Vital Signs BP (!) 125/98  (BP Location: Right Arm)   Pulse 80   Temp 97.6 F (36.4 C) (Oral)   Resp 18   Ht 5\' 5"  (1.651 m)   Wt 89.4 kg   LMP 10/31/2017   SpO2 100%   BMI 32.78 kg/m   Physical Exam  Constitutional: She is oriented to person, place, and time. She appears well-developed and well-nourished.  HENT:  Head: Normocephalic and atraumatic.  Mouth/Throat: Oropharynx is clear and moist and mucous membranes are normal.  Eyes: Pupils are equal, round, and reactive to light. Conjunctivae, EOM and lids are normal.  Neck: Full passive range of motion without pain.  Cardiovascular: Normal rate, regular rhythm, normal heart sounds and normal pulses. Exam reveals no gallop and no friction rub.  No murmur heard. Pulses:      Radial pulses are 2+ on the right side, and 2+ on the left side.       Dorsalis pedis pulses are 2+ on the right side, and 2+ on the left side.  Pulmonary/Chest: Effort normal and breath sounds normal.  Lungs clear to auscultation bilaterally.  Symmetric chest rise.  No wheezing, rales, rhonchi.  Abdominal: Soft. Normal appearance. There is no tenderness. There is no rigidity and no guarding.  Abdomen is soft, non-distended, non-tender. No rigidity, No guarding. No peritoneal signs.  Genitourinary: Uterus normal. Cervix exhibits no motion tenderness, no discharge and no friability. Right adnexum displays no mass and no tenderness. Left adnexum displays no mass and no tenderness. Vaginal discharge found.  Genitourinary Comments: The exam was performed with a chaperone present. Normal external female genitalia. No lesions, rash, or sores.  Small amount of white discharge in the vaginal vault.  No CMT.  No cervical friability.  No adnexal mass or tenderness bilaterally.  Musculoskeletal: Normal range of motion.  Neurological: She is alert and oriented to person, place, and time.  Cranial nerves III-XII intact Follows commands, Moves all extremities  5/5 strength to BUE and BLE  Sensation  intact throughout all major nerve distributions Normal coordination No gait abnormalities  No slurred speech. No facial droop.   Skin: Skin is warm and dry. Capillary refill takes less than 2 seconds.  Psychiatric: She has a normal mood and affect. Her speech is normal.  Nursing note and vitals reviewed.    ED Treatments / Results  Labs (all labs ordered are listed, but only abnormal results are displayed) Labs Reviewed  WET PREP, GENITAL - Abnormal; Notable for the following components:      Result Value   Clue Cells Wet Prep HPF POC PRESENT (*)    WBC, Wet Prep HPF POC MANY (*)    All other components within normal limits  COMPREHENSIVE METABOLIC PANEL - Abnormal; Notable for the following components:   Glucose, Bld 106 (*)    All other components within normal limits  CBC - Abnormal; Notable for the following components:   Hemoglobin 10.3 (*)    HCT 33.6 (*)    MCV 75.2 (*)    MCH 23.0 (*)    RDW 17.3 (*)    All other components within normal limits  URINALYSIS, ROUTINE W REFLEX MICROSCOPIC - Abnormal; Notable for the following components:   APPearance CLOUDY (*)    Specific Gravity, Urine >1.030 (*)    Hgb urine dipstick SMALL (*)    Leukocytes, UA TRACE (*)    All other components within normal limits  URINALYSIS, MICROSCOPIC (REFLEX) - Abnormal; Notable for the following components:   Bacteria, UA MANY (*)    All other components within normal limits  URINE CULTURE  LIPASE, BLOOD  PREGNANCY, URINE  TROPONIN I    EKG None  Radiology No results found.  Procedures Procedures (including critical care time)  Medications Ordered in ED Medications - No data to display   Initial Impression / Assessment and Plan / ED Course  I have reviewed the triage vital signs and the nursing notes.  Pertinent labs & imaging results that were available during my care of the patient were reviewed by me and considered in my medical decision making (see chart for details).      52 year old female who presents for evaluation of an episode that occurred yesterday.  She reports she had an episode sitting on her couch of diaphoresis, chills patient reports he had 2 episodes of nonbloody, nonbilious vomiting.  No abdominal pain, chest pain, difficulty breathing.  Reports that she has been her normal state of health since then.  No further episodes or vomiting.  She has been able to tolerate p.o. without any difficulty.  Also concerned she has a urinary tract infection and BV infection.  No vaginal discharge with fishy odor.  Additionally urine increased urinary frequency. Patient is afebril, non-toxic appearing, sitting comfortably on examination table. Vital signs reviewed and stable.  On exam, abdomen exam is benign not concerning for perforation, obstruction, infectious etiology.  Pelvic exam as documented above.  Pelvic exam not concerning for PID.  There was a small amount of white discharge in the vaginal vault.  Consider viral GI process versus eating abnormal food.  Also consider UTI versus BV.  Low suspicion for ACS etiology given/physical exam but will check EKG and troponin.  Wet prep is positive for clue cells.  UA shows trace leukocytes.  There are bacteria but squamous epithelium so it may be contaminant.  We will plan to send urine culture.  Urine pregnancy negative.  Lipase 34.  CMP shows slight hyperglycemia but otherwise unremarkable.  CBC shows hemoglobin of 10.3, hematocrit of 33.6, MCV of 75.2.  No previous for comparison.  Patient did just finished her menstrual cycle which could be explaining the anemia.  Troponin is negative.  Patient's episode occurred greater than 12 hours ago.  She has not had any other symptoms since then and is complaining of no symptoms right now.  She has no history of hypertension, diabetes.  At this time, one troponin is sufficient for ACS rule out.  This does not appear to be an atypical presentation of ACS.  At this time, given  reassuring work-up and exam, no indication for CT abdomen pelvis as patient has no signs of surgical abdomen on exam.  Pelvic exam as documented above.  There is a small amount of white discharge noted in the vaginal vault.  No CMT that would be concerning for PID.  Patient did not want testing for gonorrhea/chlamydia.   Discussed results with patient's.  Urine sent for culture.  Given that she has increased urinary frequency with presence of leukocytes, will plan to treat it.  Additionally, we will plan to treat for clue cells as wet prep is positive for clue cells.  She is tolerating p.o. in the department any difficulty.  Vital signs are stable.  Encourage at home supportive care measures. Patient had ample opportunity for questions and discussion. All patient's questions were answered with full understanding. Strict return precautions discussed. Patient expresses understanding and agreement to plan.   Final Clinical Impressions(s) / ED Diagnoses   Final diagnoses:  Acute cystitis without hematuria  Bacterial vaginosis    ED Discharge Orders         Ordered    metroNIDAZOLE (FLAGYL) 500 MG tablet  2 times daily     11/04/17 1150    cephALEXin (KEFLEX) 500 MG capsule  2 times daily     11/04/17 1150           Rosana Hoes 11/04/17 1431    Alvira Monday, MD 11/04/17 2218

## 2017-11-04 NOTE — ED Notes (Signed)
Went in room to discharge patient.  Patient on the phone and asked me to hold on while she completed her conversation.  Stepped out of room.  Will discharge patient when she completes phone call.

## 2017-11-04 NOTE — Discharge Instructions (Signed)
Take antibiotics as directed. Please take all of your antibiotics until finished.  Take Flagyl as directed.  It is very important that you do not consume any alcohol while taking this medication as it will cause you to become violently ill.  Make sure she is staying hydrated drinking plenty of fluids.  Follow-up with your primary care doctor in the next 3 to 4 days for further evaluation.  Return to the emergency department for chest pain, difficulty breathing, abdominal pain, persistent vomiting, any other worsening concerning symptoms.

## 2017-11-04 NOTE — ED Notes (Signed)
Tolerating Pos well

## 2017-11-04 NOTE — ED Triage Notes (Addendum)
Reports headache since yesterday.  States that last night she had 3 episodes of vomiting.  Additionally reports she feels like she has a UTI.  Denies abdominal pain.

## 2017-11-05 LAB — URINE CULTURE

## 2018-10-18 ENCOUNTER — Other Ambulatory Visit: Payer: Self-pay

## 2018-10-18 DIAGNOSIS — Z20822 Contact with and (suspected) exposure to covid-19: Secondary | ICD-10-CM

## 2018-10-19 LAB — NOVEL CORONAVIRUS, NAA: SARS-CoV-2, NAA: NOT DETECTED

## 2018-12-27 ENCOUNTER — Encounter (HOSPITAL_BASED_OUTPATIENT_CLINIC_OR_DEPARTMENT_OTHER): Payer: Self-pay

## 2018-12-27 ENCOUNTER — Other Ambulatory Visit: Payer: Self-pay

## 2018-12-27 ENCOUNTER — Emergency Department (HOSPITAL_BASED_OUTPATIENT_CLINIC_OR_DEPARTMENT_OTHER)
Admission: EM | Admit: 2018-12-27 | Discharge: 2018-12-27 | Disposition: A | Discharge status: Home / Self Care | Payer: BLUE CROSS/BLUE SHIELD | Attending: Emergency Medicine | Admitting: Emergency Medicine

## 2018-12-27 DIAGNOSIS — R519 Headache, unspecified: Secondary | ICD-10-CM

## 2018-12-27 DIAGNOSIS — Z79899 Other long term (current) drug therapy: Secondary | ICD-10-CM | POA: Diagnosis not present

## 2018-12-27 DIAGNOSIS — I10 Essential (primary) hypertension: Secondary | ICD-10-CM

## 2018-12-27 LAB — BASIC METABOLIC PANEL
Anion gap: 7 (ref 5–15)
BUN: 13 mg/dL (ref 6–20)
CO2: 22 mmol/L (ref 22–32)
Calcium: 9.2 mg/dL (ref 8.9–10.3)
Chloride: 108 mmol/L (ref 98–111)
Creatinine, Ser: 0.7 mg/dL (ref 0.44–1.00)
GFR calc Af Amer: >60 mL/min (ref >60)
GFR calc non Af Amer: >60 mL/min (ref >60)
Glucose, Bld: 94 mg/dL (ref 70–99)
Potassium: 4 mmol/L (ref 3.5–5.1)
Sodium: 137 mmol/L (ref 135–145)

## 2018-12-27 LAB — CBC WITH DIFFERENTIAL/PLATELET
Abs Immature Granulocytes: 0 10*3/uL (ref 0.00–0.07)
Basophils Absolute: 0 10*3/uL (ref 0.0–0.1)
Basophils Relative: 0 %
Eosinophils Absolute: 0.2 10*3/uL (ref 0.0–0.5)
Eosinophils Relative: 4 %
HCT: 33.9 % — ABNORMAL LOW (ref 36.0–46.0)
Hemoglobin: 9.8 g/dL — ABNORMAL LOW (ref 12.0–15.0)
Immature Granulocytes: 0 %
Lymphocytes Relative: 41 %
Lymphs Abs: 2 10*3/uL (ref 0.7–4.0)
MCH: 22.8 pg — ABNORMAL LOW (ref 26.0–34.0)
MCHC: 28.9 g/dL — ABNORMAL LOW (ref 30.0–36.0)
MCV: 79 fL — ABNORMAL LOW (ref 80.0–100.0)
Monocytes Absolute: 0.6 10*3/uL (ref 0.1–1.0)
Monocytes Relative: 11 %
Neutro Abs: 2.1 10*3/uL (ref 1.7–7.7)
Neutrophils Relative %: 44 %
Platelets: 285 10*3/uL (ref 150–400)
RBC: 4.29 MIL/uL (ref 3.87–5.11)
RDW: 16.2 % — ABNORMAL HIGH (ref 11.5–15.5)
WBC: 4.9 10*3/uL (ref 4.0–10.5)
nRBC: 0 % (ref 0.0–0.2)

## 2018-12-27 MED ORDER — IBUPROFEN 400 MG PO TABS
600.0000 mg | ORAL_TABLET | Freq: Once | ORAL | Status: AC
  Administered 2018-12-27: 600 mg via ORAL
  Filled 2018-12-27: qty 1

## 2018-12-27 NOTE — ED Provider Notes (Signed)
[facility] EMERGENCY DEPARTMENT Provider Note   CSN: [id number] Arrival date & time: 12/27/18  0915     History   Chief Complaint Chief Complaint  Patient presents with  . Headache    HPI Janice Green is a 53 y.o. female.  She is here for evaluation of headache and elevated blood pressure.  She said she woke up with a headache today.  [facility] and found it to be elevated.  She also said she had a little bit of a cough last night and took some Tums and it improved.  She gets headaches before her cycle and they usually respond to [facility].  She said she is due for her cycle but did not take any BC powder this morning.     The history is provided by the patient.  Headache Pain location:  Generalized Quality:  Dull Radiates to:  Does not radiate Severity currently:  5/10 Severity at highest:  5/10 Timing:  Intermittent Progression:  Unchanged Chronicity:  Recurrent Similar to prior headaches: yes   Context comment:  Menstrual Relieved by:  None tried Worsened by:  Nothing Ineffective treatments:  None tried Associated symptoms: blurred vision (sometimes - needs glasses) and cough   Associated symptoms: no abdominal pain, no diarrhea, no eye pain, no fever, no loss of balance, no nausea, no neck pain, no numbness, no sore throat, no tingling, no vomiting and no weakness     Past Medical History:  Diagnosis Date  . BV (bacterial vaginosis)     There are no active problems to display for this patient.   Past Surgical History:  Procedure Laterality Date  . BACK SURGERY    . BACK SURGERY       OB History   No obstetric history on file.      Home Medications    Prior to Admission medications   Medication Sig Start Date End Date Taking? Authorizing Provider  ciprofloxacin (CIPRO) 500 MG tablet Take 1 tablet (500 mg total) by mouth every 12 (twelve) hours. 09/23/13   Rolland PorterJames, Mark, MD  cyclobenzaprine  (FLEXERIL) 10 MG tablet Take 1 tablet (10 mg total) by mouth 2 (two) times daily as needed for muscle spasms. 06/22/12   Lurene ShadowPhelps, Erin O, PA-C  doxycycline (VIBRAMYCIN) 100 MG capsule Take 1 capsule (100 mg total) by mouth 2 (two) times daily. 01/30/12   Jethro Bastosrawford, Anne W, NP  HYDROcodone-acetaminophen (NORCO/VICODIN) 5-325 MG per tablet Take 1 tablet by mouth every 4 (four) hours as needed. 09/23/13   Rolland PorterJames, Mark, MD  meloxicam (MOBIC) 7.5 MG tablet Take 2 tablets (15 mg total) by mouth daily. 06/22/12   Lurene ShadowPhelps, Erin O, PA-C  metroNIDAZOLE (FLAGYL) 500 MG tablet Take 1 tablet (500 mg total) by mouth 2 (two) times daily. 11/04/17   Maxwell CaulLayden, Lindsey A, PA-C    Family History No family history on file.  Social History Social History   Tobacco Use  . Smoking status: Never Smoker  . Smokeless tobacco: Never Used  Substance Use Topics  . Alcohol use: No  . Drug use: No     Allergies   Patient has no known allergies.   Review of Systems Review of Systems  Constitutional: Negative for fever.  HENT: Negative for sore throat.   Eyes: Positive for blurred vision (sometimes - needs glasses) and visual disturbance. Negative for pain.  Respiratory: Positive for cough.   Cardiovascular: Negative for chest pain.  Gastrointestinal: Negative  for abdominal pain, diarrhea, nausea and vomiting.  Genitourinary: Negative for dysuria.  Musculoskeletal: Negative for neck pain.  Skin: Negative for rash.  Neurological: Positive for headaches. Negative for weakness, numbness and loss of balance.     Physical Exam Updated Vital Signs BP (!) 156/99 (BP Location: Right Arm)   Pulse 77   Temp 98.1 F (36.7 C) (Oral)   Resp 18   Ht 5\' 5"  (1.651 m)   Wt 90.7 kg   LMP 12/03/2018   SpO2 100%   BMI 33.28 kg/m   Physical Exam Vitals signs and nursing note reviewed.  Constitutional:      General: She is not in acute distress.    Appearance: She is well-developed.  HENT:     Head: Normocephalic and  atraumatic.     Right Ear: Tympanic membrane normal.     Left Ear: Tympanic membrane normal.     Mouth/Throat:     Mouth: Mucous membranes are moist.     Pharynx: Oropharynx is clear.  Eyes:     Extraocular Movements: Extraocular movements intact.     Conjunctiva/sclera: Conjunctivae normal.     Pupils: Pupils are equal, round, and reactive to light.  Neck:     Musculoskeletal: Neck supple.  Cardiovascular:     Rate and Rhythm: Normal rate and regular rhythm.     Heart sounds: No murmur.  Pulmonary:     Effort: Pulmonary effort is normal. No respiratory distress.     Breath sounds: Normal breath sounds.  Abdominal:     Palpations: Abdomen is soft.     Tenderness: There is no abdominal tenderness.  Skin:    General: Skin is warm and dry.     Capillary Refill: Capillary refill takes less than 2 seconds.  Neurological:     General: No focal deficit present.     Mental Status: She is alert.     GCS: GCS eye subscore is 4. GCS verbal subscore is 5. GCS motor subscore is 6.     Cranial Nerves: No cranial nerve deficit.     Sensory: No sensory deficit.     Motor: No weakness.     Gait: Gait normal.      ED Treatments / Results  Labs (all labs ordered are listed, but only abnormal results are displayed) Labs Reviewed  CBC WITH DIFFERENTIAL/PLATELET - Abnormal; Notable for the following components:      Result Value   Hemoglobin 9.8 (*)    HCT 33.9 (*)    MCV 79.0 (*)    MCH 22.8 (*)    MCHC 28.9 (*)    RDW 16.2 (*)    All other components within normal limits  BASIC METABOLIC PANEL    EKG None  Radiology No results found.  Procedures Procedures (including critical care time)  Medications Ordered in ED Medications  ibuprofen (ADVIL) tablet 600 mg (600 mg Oral Given 12/27/18 0943)     Initial Impression / Assessment and Plan / ED Course  I have reviewed the triage vital signs and the nursing notes.  Pertinent labs & imaging results that were available  during my care of the patient were reviewed by me and considered in my medical decision making (see chart for details).  Clinical Course as of Dec 27 1706  Wed Dec 26, 9040  2455 53 year old with family history of hypertension but no personal history here with elevated blood pressure and headache.  She said she gets this headache monthly before her cycle.  She is nonfocal neuro exam afebrile sats 100%.  Blood pressure mildly elevated at 156/99.   [MB]  [phone] We will check chemistry to make sure her renal functions okay.  Ibuprofen for headache.   [MB]    Clinical Course User Index [MB] Terrilee Files, MD   Headache improved. Labs show anemia, at baseline. Otherwise nl. Patient encouraged to low salt, increased exercise and to followup with primary care.      Final Clinical Impressions(s) / ED Diagnoses   Final diagnoses:  Generalized headache  Hypertension, unspecified type    ED Discharge Orders    None       Terrilee Files, MD 12/27/18 1710

## 2018-12-27 NOTE — ED Triage Notes (Signed)
Pt reports waking up with a headache today states that she went to [facility] to check her BP and it was elevated in the 150s. Pt also reports recent dry cough X2 days.

## 2018-12-27 NOTE — Discharge Instructions (Signed)
You were seen in the emergency department for headache and elevated blood pressure.  Your blood pressure improved while you were here.  Your blood work showed that you were anemic but unchanged from a year ago.  It will be important for you to establish care with a primary care doctor so you can have your blood pressure monitored as you may end up needing to go on medications.  Please return the emergency department if any worsening or concerning symptoms.

## 2018-12-27 NOTE — ED Notes (Signed)
Pt states she has headaches at home frequently for several months.  Some blurry vision with it occasionally.  Pt relates HTN runs in her family.  Her BP was high at [facility] today and was concerned.  Denies weakness,  Grips equal.  No blurry vision or dizziness.

## 2018-12-27 NOTE — ED Notes (Signed)
Pt reports some indigestion last night states that she took some tums. Also c/o pain in right arm, denies any chest pain.

## 2019-05-23 ENCOUNTER — Ambulatory Visit: Payer: Self-pay | Attending: Internal Medicine

## 2019-05-23 DIAGNOSIS — Z20822 Contact with and (suspected) exposure to covid-19: Secondary | ICD-10-CM

## 2019-05-24 LAB — NOVEL CORONAVIRUS, NAA: SARS-CoV-2, NAA: NOT DETECTED

## 2019-05-24 LAB — SARS-COV-2, NAA 2 DAY TAT

## 2022-11-08 ENCOUNTER — Other Ambulatory Visit: Payer: Self-pay | Admitting: Obstetrics and Gynecology

## 2022-11-08 DIAGNOSIS — R928 Other abnormal and inconclusive findings on diagnostic imaging of breast: Secondary | ICD-10-CM

## 2022-11-17 ENCOUNTER — Ambulatory Visit
Admission: RE | Admit: 2022-11-17 | Discharge: 2022-11-17 | Disposition: A | Discharge status: Home / Self Care | Payer: BLUE CROSS/BLUE SHIELD | Source: Ambulatory Visit | Attending: Obstetrics and Gynecology | Admitting: Obstetrics and Gynecology

## 2022-11-17 ENCOUNTER — Ambulatory Visit
Admission: RE | Admit: 2022-11-17 | Discharge: 2022-11-17 | Disposition: A | Discharge status: Home / Self Care | Payer: BC Managed Care – PPO | Source: Ambulatory Visit | Attending: Obstetrics and Gynecology | Admitting: Obstetrics and Gynecology

## 2022-11-17 ENCOUNTER — Other Ambulatory Visit: Payer: Self-pay | Admitting: Obstetrics and Gynecology

## 2022-11-17 DIAGNOSIS — R928 Other abnormal and inconclusive findings on diagnostic imaging of breast: Secondary | ICD-10-CM

## 2022-11-17 DIAGNOSIS — N6320 Unspecified lump in the left breast, unspecified quadrant: Secondary | ICD-10-CM

## 2022-11-18 ENCOUNTER — Ambulatory Visit
Admission: RE | Admit: 2022-11-18 | Discharge: 2022-11-18 | Disposition: A | Discharge status: Home / Self Care | Payer: BC Managed Care – PPO | Source: Ambulatory Visit | Attending: Obstetrics and Gynecology | Admitting: Obstetrics and Gynecology

## 2022-11-18 DIAGNOSIS — N6320 Unspecified lump in the left breast, unspecified quadrant: Secondary | ICD-10-CM

## 2022-11-18 HISTORY — PX: BREAST BIOPSY: SHX20

## 2022-11-19 LAB — SURGICAL PATHOLOGY
